# Patient Record
Sex: Male | Born: 1960 | Hispanic: No | Marital: Single | Smoking: Current every day smoker
Health system: Southern US, Community
[De-identification: ages and names within clinical notes are randomized; demographics above are authoritative.]

## PROBLEM LIST (undated history)

## (undated) HISTORY — PX: FINGER SURGERY: SHX640

## (undated) HISTORY — PX: HERNIA REPAIR: SHX51

---

## 1999-12-20 ENCOUNTER — Ambulatory Visit (HOSPITAL_BASED_OUTPATIENT_CLINIC_OR_DEPARTMENT_OTHER): Admission: RE | Admit: 1999-12-20 | Discharge: 1999-12-20 | Payer: Self-pay | Admitting: *Deleted

## 2003-11-13 ENCOUNTER — Emergency Department (HOSPITAL_COMMUNITY): Admission: EM | Admit: 2003-11-13 | Discharge: 2003-11-13 | Payer: Self-pay | Admitting: Emergency Medicine

## 2008-07-23 ENCOUNTER — Encounter (INDEPENDENT_AMBULATORY_CARE_PROVIDER_SITE_OTHER): Payer: Self-pay | Admitting: Orthopedic Surgery

## 2008-07-23 ENCOUNTER — Ambulatory Visit (HOSPITAL_COMMUNITY): Admission: EM | Admit: 2008-07-23 | Discharge: 2008-07-23 | Payer: Self-pay | Admitting: Emergency Medicine

## 2011-05-15 NOTE — Op Note (Signed)
Rickey Barnes, Rickey Barnes             ACCOUNT NO.:  [id number]   MEDICAL RECORD NO.:  [id number]          PATIENT TYPE:  INP   LOCATION:  1831                         FACILITY:  [facility]   PHYSICIAN:  Rickey Done, MD  DATE OF BIRTH:  03/28/1961   DATE OF PROCEDURE:  07/23/2008  DATE OF DISCHARGE:  07/23/2008                               OPERATIVE REPORT   PREOPERATIVE DIAGNOSIS:  Right small finger amputation, avulsion-type  amputation.   POSTOPERATIVE DIAGNOSIS:  Right small finger amputation, avulsion-type  amputation.   ATTENDING SURGEON:  Rickey Covert, MD, who scrubbed and present for  the entire procedure.   ASSISTANT SURGEON:  None.   PROCEDURES:  1. Right small finger debridement of skin, subcutaneous tissue, and      bone associated with open fracture.  2. Right small finger amputation with local neurectomies and primary      closure.   ANESTHESIA:  General via LMA.   TOURNIQUET TIME:  Less than 30 minutes at 225 mmHg.   SURGICAL INDICATIONS:  Rickey Barnes is a 50 year old right-hand-dominant  gentleman who sustained an avulsion-type amputation to his right small  finger from the level of the proximal portion of the distal phalanx  distally.  The patient presented with a loss of the digit in the  emergency department.  After the patient was seen and examined in the  emergency department, it was probably felt that the patient undergo  debridement and revision and completion of the amputation.  The risks,  benefits, and alternatives were discussed in detail with the patient and  signed informed consent was obtained.  We talked about reimplantation of  the digit, and we talked about amputation.  After discussing, it was my  recommendation at the end that the patient undergo the above procedure.   DESCRIPTION OF PROCEDURE:  The patient was properly identified in the  preoperative holding area and a marker was made on the right hand and  indicated correct operative  site.  The patient was then brought back to  the operating room and placed supine on the anesthesia room table where  general anesthesia was administered via LMA.  The patient tolerated this  well.  A well-padded tourniquet was then placed on the right forearm and  sealed with a 1000 drape.  The right upper extremity was then prepped  and draped in normal sterile fashion.  The patient received preoperative  antibiotics prior to any skin incisions.  After the prep and drape, the  time-out was called, the correct side was identified, and the procedure  was then begun.  The patient's amputation was through the level of the  proximal portion of the distal phalanx.  The FDP was noted to be still  attached to the distal segment.  The local neurectomies were then Barnes  both radially and ulnarly and the nerve was allowed to retract  proximally.  Following the neurectomies, debridement of skin,  subcutaneous tissues, and portion of the bone was then carried out.  The  wound was then thoroughly irrigated.  Following this, the skin flaps  were then closed  primarily.  There was no need for advanced flap  closure.  The wounds were then closed with 5-0 nylon simple sutures.  Marcaine 0.25% 9 mL were then infiltrated with the flexor tendon sheath  block.  The tourniquet was deflated with good perfusion of the finger.  Adaptic dressing and a sterile compressive dressing was then applied to  the digit.  The patient tolerated this well, was extubated, and taken to  recovery room in good condition.   POSTOPERATIVE PLAN:  The patient will be seen back in the office in 5  days for wound check and then application of a small protector finger  splint.  He will be discharged on the day of surgery.      Rickey Done, MD  Electronically Signed     FWO/MEDQ  D:  07/23/2008  T:  07/24/2008  Job:  [id number]

## 2013-08-02 ENCOUNTER — Emergency Department (HOSPITAL_COMMUNITY): Payer: Self-pay

## 2013-08-02 ENCOUNTER — Encounter (HOSPITAL_COMMUNITY): Payer: Self-pay | Admitting: Emergency Medicine

## 2013-08-02 ENCOUNTER — Emergency Department (HOSPITAL_COMMUNITY)
Admission: EM | Admit: 2013-08-02 | Discharge: 2013-08-02 | Disposition: A | Discharge status: Home / Self Care | Payer: Self-pay | Attending: Dermatology | Admitting: Dermatology

## 2013-08-02 DIAGNOSIS — F17200 Nicotine dependence, unspecified, uncomplicated: Secondary | ICD-10-CM | POA: Insufficient documentation

## 2013-08-02 DIAGNOSIS — S50311A Abrasion of right elbow, initial encounter: Secondary | ICD-10-CM

## 2013-08-02 DIAGNOSIS — Z23 Encounter for immunization: Secondary | ICD-10-CM | POA: Insufficient documentation

## 2013-08-02 DIAGNOSIS — Y9289 Other specified places as the place of occurrence of the external cause: Secondary | ICD-10-CM | POA: Insufficient documentation

## 2013-08-02 DIAGNOSIS — Y9389 Activity, other specified: Secondary | ICD-10-CM | POA: Insufficient documentation

## 2013-08-02 DIAGNOSIS — IMO0002 Reserved for concepts with insufficient information to code with codable children: Secondary | ICD-10-CM | POA: Insufficient documentation

## 2013-08-02 MED ORDER — TETANUS-DIPHTH-ACELL PERTUSSIS 5-2.5-18.5 LF-MCG/0.5 IM SUSP
0.5000 mL | Freq: Once | INTRAMUSCULAR | Status: AC
  Administered 2013-08-02: 0.5 mL via INTRAMUSCULAR
  Filled 2013-08-02: qty 0.5

## 2013-08-02 MED ORDER — HYDROCODONE-ACETAMINOPHEN 5-325 MG PO TABS: 1.0000 | ORAL_TABLET | Freq: Four times a day (QID) | ORAL | Status: AC | PRN
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
## 2013-08-11 NOTE — ED Provider Notes (Signed)
Medical screening examination/treatment/procedure(s) were performed by non-physician practitioner and as supervising physician I was immediately available for consultation/collaboration.   Suzi Roots, MD 08/11/13 2117
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
## 2014-11-28 ENCOUNTER — Encounter (HOSPITAL_COMMUNITY): Payer: Self-pay | Admitting: *Deleted

## 2014-11-28 ENCOUNTER — Emergency Department (HOSPITAL_COMMUNITY)
Admission: EM | Admit: 2014-11-28 | Discharge: 2014-11-28 | Disposition: A | Discharge status: Home / Self Care | Payer: Self-pay | Attending: Emergency Medicine | Admitting: Emergency Medicine

## 2014-11-28 DIAGNOSIS — K0889 Other specified disorders of teeth and supporting structures: Secondary | ICD-10-CM

## 2014-11-28 DIAGNOSIS — K088 Other specified disorders of teeth and supporting structures: Secondary | ICD-10-CM | POA: Insufficient documentation

## 2014-11-28 DIAGNOSIS — Z720 Tobacco use: Secondary | ICD-10-CM | POA: Insufficient documentation

## 2014-11-28 MED ORDER — AMOXICILLIN 500 MG PO CAPS: 500.0000 mg | ORAL_CAPSULE | Freq: Three times a day (TID) | ORAL | Status: AC

## 2014-11-28 NOTE — ED Provider Notes (Signed)
CSN: [id number]     Arrival date & time 11/28/14  1444 History   First MD Initiated Contact with Patient 11/28/14 1601     Chief Complaint  Patient presents with  . Dental Pain     (Consider location/radiation/quality/duration/timing/severity/associated sxs/prior Treatment) The history is provided by the patient. No language interpreter was used.  Rickey Barnes is a 53 y/o M with no known significant PMHx presenting to the ED with dental issues. Patient reported that he has been having loosening of his teeth since Thanksgiving. Patient reported that the loosening of his teeth is in the back portion of his mouth on the upper and lower aspects. Patient stated that he is not sure whether or not be bit into something hard. Stated that the pain is worse when he chews on the right side. Denied fever, chills, neck pain, neck stiffness, drainage, bleeding, chest pain, shortness of breath, difficulty breathing, swelling, numbness, tingling. PCP none   History reviewed. No pertinent past medical history. Past Surgical History  Procedure Laterality Date  . Finger surgery Right pinky finger  . Hernia repair     No family history on file. History  Substance Use Topics  . Smoking status: Current Every Day Smoker  . Smokeless tobacco: Not on file  . Alcohol Use: Yes    Review of Systems  Constitutional: Negative for fever and chills.  HENT: Positive for dental problem. Negative for trouble swallowing.   Respiratory: Negative for chest tightness and shortness of breath.       Allergies  Review of patient's allergies indicates no known allergies.  Home Medications   Prior to Admission medications   Medication Sig Start Date End Date Taking? Authorizing Provider  amoxicillin (AMOXIL) 500 MG capsule Take 1 capsule (500 mg total) by mouth 3 (three) times daily. 11/28/14   Missey Hasley, PA-C  HYDROcodone-acetaminophen (NORCO/VICODIN) 5-325 MG per tablet Take 1 tablet by mouth every 6  (six) hours as needed for pain. 08/02/13   Jamesetta Orleanshristopher W Lawyer, PA-C   BP 144/91 mmHg  Pulse 68  Temp(Src) 98.8 F (37.1 C) (Oral)  Resp 20  SpO2 100% Physical Exam  Constitutional: He is oriented to person, place, and time. He appears well-developed and well-nourished. No distress.  HENT:  Head: Normocephalic and atraumatic.  Mouth/Throat: Oropharynx is clear and moist.  Mild facial swelling noted to the right side of the face with negative erythema, inflammation, lesions, sores. Negative pain upon palpation. Negative trismus. Uvula midline with symmetrical elevation. Negative uvula swelling. Negative sublingual lesions noted.   Second molar and third molar of the right mandibular jawline noted to be loose and angulated. Gum tissue appears to be pushing the teeth up. Mild loosening of the teeth upon palpation. Angulation of the right second premolar of the maxillary jawline. Negative motion upon palpation or pain upon palpation.    Eyes: Conjunctivae and EOM are normal. Pupils are equal, round, and reactive to light. Right eye exhibits no discharge. Left eye exhibits no discharge.  Neck: Normal range of motion. Neck supple. No tracheal deviation present.  Negative neck stiffness Negative nuchal rigidity  Negative cervical lymphadenopathy  Negative meningeal signs  Cardiovascular: Normal rate, regular rhythm and normal heart sounds.  Exam reveals no friction rub.   No murmur heard. Pulmonary/Chest: Effort normal and breath sounds normal. No respiratory distress. He has no wheezes. He has no rales.  Patient is able to speak in full sentences without difficulty  Negative use of accessory muscles Negative stridor  Musculoskeletal: Normal range of motion.  Full ROM to upper and lower extremities without difficulty noted, negative ataxia noted.  Lymphadenopathy:    He has no cervical adenopathy.  Neurological: He is alert and oriented to person, place, and time. No cranial nerve deficit. He  exhibits normal muscle tone. Coordination normal.  Skin: Skin is warm and dry. No rash noted. He is not diaphoretic. No erythema.  Psychiatric: He has a normal mood and affect. His behavior is normal. Thought content normal.  Nursing note and vitals reviewed.   ED Course  Procedures (including critical care time) Labs Review Labs Reviewed - No data to display  Imaging Review No results found.   EKG Interpretation None      5:13 PM Patient seen and assessed by attending physician, Dr. Rhunette CroftNanavati. As per physician, reported that patient can be discharged home. Reported that teeth cannot be pulled, will be oral surgeon. [facility]   MDM   Final diagnoses:  Pain, dental  Loosening of tooth    Medications - No data to display  Filed Vitals:   11/28/14 1456 11/28/14 1701  BP: 142/83 144/91  Pulse: 70 68  Temp: 98.8 F (37.1 C)   TempSrc: Oral   Resp: 16 20  SpO2: 100% 100%   Patient presenting to the ED with loosening of his teeth to upper and lower jawline on the right side. Negative signs infection. Negative abscess or drainable abscess noted. Doubt peritonsillar abscess. Doubt retropharyngeal abscess. Loosening of teeth identified to exam. Poor dentition noted with beginnings of gingivitis and plaque. Patient seen and assessed by attending physician, Dr. Rhunette CroftNanavati. As per physician, reported that these teeth need to be extracted by an oral surgeon - recommended patient to be referred to oral surgeon/dentist. Negative signs of infection. Patient stable, afebrile. Patient not septic. Discharged patient. Discussed with patient the importance of following up with dentist and the need for those teeth to be properly extracted. Discussed with patient proper diet - recommended patient to refrain from foods that require chewing. Discussed with patient to closely monitor symptoms and if symptoms are to worsen or change to report back to the ED - strict return  instructions given.  Patient agreed to plan of care, understood, all questions answered.   Raymon MuttonMarissa Coady Train, PA-C 11/28/14 1816  Derwood KaplanAnkit Nanavati, MD 11/28/14 [phone]

## 2014-11-28 NOTE — ED Notes (Signed)
Declined W/C at D/C and was escorted to lobby by RN. 

## 2014-11-28 NOTE — Discharge Instructions (Signed)
Please call and set-up an appointment with Dentist for teeth to be pulled Please drink and stay hydrated Please try not to use teeth to bit - please stick with a soft diet of yogurt, pureed foods. Nothing that requires chewing.  Please continue to monitor symptoms closely and if symptoms are to worsen or change (fever greater than 101, chills, sweating, nausea, vomiting, chest pain, shortness of breathe, difficulty breathing, weakness, numbness, tingling, worsening or changes to pain pattern, swelling, bleeding, drainage) please report back to the Emergency Department immediately.   Dental Pain A tooth ache may be caused by cavities (tooth decay). Cavities expose the nerve of the tooth to air and hot or cold temperatures. It may come from an infection or abscess (also called a boil or furuncle) around your tooth. It is also often caused by dental caries (tooth decay). This causes the pain you are having. DIAGNOSIS  Your caregiver can diagnose this problem by exam. TREATMENT   If caused by an infection, it may be treated with medications which kill germs (antibiotics) and pain medications as prescribed by your caregiver. Take medications as directed.  Only take over-the-counter or prescription medicines for pain, discomfort, or fever as directed by your caregiver.  Whether the tooth ache today is caused by infection or dental disease, you should see your dentist as soon as possible for further care. SEEK MEDICAL CARE IF: The exam and treatment you received today has been provided on an emergency basis only. This is not a substitute for complete medical or dental care. If your problem worsens or new problems (symptoms) appear, and you are unable to meet with your dentist, call or return to this location. SEEK IMMEDIATE MEDICAL CARE IF:   You have a fever.  You develop redness and swelling of your face, jaw, or neck.  You are unable to open your mouth.  You have severe pain uncontrolled by  pain medicine. MAKE SURE YOU:   Understand these instructions.  Will watch your condition.  Will get help right away if you are not doing well or get worse. Document Released: 12/17/2005 Document Revised: 03/10/2012 Document Reviewed: 08/04/2008 [facility] Patient Information 2015 [place], [place]. This information is not intended to replace advice given to you by your health care provider. Make sure you discuss any questions you have with your health care provider.   Emergency Department Resource Guide 1) Find a Doctor and Pay Out of Pocket Although you won't have to find out who is covered by your insurance plan, it is a good idea to ask around and get recommendations. You will then need to call the office and see if the doctor you have chosen will accept you as a new patient and what types of options they offer for patients who are self-pay. Some doctors offer discounts or will set up payment plans for their patients who do not have insurance, but you will need to ask so you aren't surprised when you get to your appointment.  2) Contact Your Local Health Department Not all health departments have doctors that can see patients for sick visits, but many do, so it is worth a call to see if yours does. If you don't know where your local health department is, you can check in your phone book. The CDC also has a tool to help you locate your state's health department, and many state websites also have listings of all of their local health departments.  3) Find a Walk-in Clinic If your illness is  not likely to be very severe or complicated, you may want to try a walk in clinic. These are popping up all over the country in pharmacies, drugstores, and shopping centers. They're usually staffed by nurse practitioners or physician assistants that have been trained to treat common illnesses and complaints. They're usually fairly quick and inexpensive. However, if you have serious medical issues or chronic  medical problems, these are probably not your best option.  No Primary Care Doctor: - Call Health Connect at  [phone] - they can help you locate a primary care doctor that  accepts your insurance, provides certain services, etc. - Physician Referral Service- [phone]  Chronic Pain Problems: Organization         Address  Phone   Notes  [facility]  [phone] Patients need to be referred by their primary care doctor.   Medication Assistance: Organization         Address  Phone   Notes  [facility] Medication [facility] [address] [place] [place], [place] [address] [phone] --Must be a resident of [facility] -- Must have NO insurance coverage whatsoever (no Medicaid/ Medicare, etc.) -- The pt. MUST have a primary care doctor that directs their care regularly and follows them in the community   [facility]ist  [phone]   [employer]  [phone]    Agencies that provide inexpensive medical care: Organization         Address  Phone   Notes  [facility]  [phone]   [facility]    [phone]   [facility] [address] [place], [place] [address] [phone]   [facility] [address] [place], [place] [phone]   Planned Parenthood    [phone]   [facility]    [phone]   [facility]  [address], [place] Phone:  [phone], Fax:  [phone] Hours of Operation:  9 am - 6 pm, M-F.  Also accepts Medicaid/Medicare and self-pay.  [facility]  [address], [place] Phone: [phone], Fax: [phone]. Hours of Operation:  8:30 am - 5:30 pm, M-F.  Also accepts Medicaid and self-pay.  [facility] [place] [address], [place] Phone: [phone]   [facility] [address] Natasha Bence [place], [place] [phone],  Ext. [phone] Mondays & Thursdays: 7-9 AM.  First 15 patients are seen on a first come, first serve basis.    Medicaid-accepting [facility] Providers:  Organization         Address  Phone   Notes  [facility] [address], [place] [phone] Also accepts self-pay patients.  [facility] [address] Laurell Josephs [place], [place]  [phone]   [facility] [address], [place] [phone]   [facility] [address], [place] [phone]   Renaye Rakers [address], [place]   [phone] Only accepts [place] patients after they have their name applied to their card.   Self-Pay (no insurance) in [facility]:  Organization         Address  Phone   Notes  Sickle Cell Patients, [facility] [address] [place], [place] [phone]   Patrcia Dolly  [facility] [address], [place] [phone]) [phone]   [facility] [place]  [address], [place] [phone]) [phone]   [facility]/Dr. Osei-Bonsu  [address] [place] [address], [place] or [id number], [place] [phone] Phone number for both [place] and [place] locations is the same.  [facility] [address], [place] [phone]) [phone]   [facility] [address] [place] [address], [place] or [address] [place] [address] [phone]) [phone]) [phone]   [facility] [address], [place] [phone]) [phone], phone; [phone], fax Sees patients 1st and 3rd Saturday of every month.  Must not qualify for public or private insurance (i.e. Medicaid, Medicare, [place] Health Choice, Veterans' Benefits)  Household income should be no more than 200% of the poverty level The clinic cannot treat you if you are pregnant or think you are pregnant  Sexually transmitted diseases are not  treated at the clinic.    Dental Care: Organization         Address  Phone  Notes  [facility] [address], [place] [phone]) [phone] Accepts children up to age 921 who are enrolled in [place]icaid or [place] Health Choice; pregnant women with a Medicaid card; and children who have applied for Medicaid or [place] Health Choice, but were declined, whose parents can pay a reduced fee at time of service.  [facility] [place]  [address], [place] [phone] Accepts children up to age 52 who are enrolled in [place]icaid or [place] Health Choice; pregnant women with a Medicaid card; and children who have applied for Medicaid or [place] Health Choice, but were declined, whose parents can pay a reduced fee at time of service.  [facility]  [address], [place] [phone]) [phone] Patients are seen by appointment only. Walk-ins are not accepted. [facility] will see patients 52 years of age and older. Monday - Tuesday (8am-5pm) Most Wednesdays (8:30-5pm) $30 per visit, cash only  [facility]  [address], [facility] [phone] Patients are seen by appointment only. Walk-ins are not accepted. [facility] will see patients 52 years of age and older. One Wednesday Evening (Monthly: Volunteer Based).  $30 per visit, cash only  [employer] [facility]  [phone]) [phone] for adults; Children under age 264, call Graduate Pediatric Dentistry at [phone]) [phone]. Children aged 504-14, please call [phone]) [phone] to request a pediatric application.  Dental services are provided in all areas of dental care including fillings, crowns and bridges, complete and partial dentures, implants, gum treatment, root canals, and extractions. Preventive care is also provided. Treatment is provided to both adults and children. Patients are selected via a lottery and there is  often a waiting list.   [facility] [address], [place]  [phone] [email or web address]   [facility] [address], [place], [place] [phone], Ext. [phone] Second and Fourth Thursday of each month, opens at 6:30 AM; Clinic ends at 9 AM.  Patients are seen on a first-come first-served basis, and a limited number are seen during each clinic.   [facility]  [address], [place], [place] [phone]   Eligibility Requirements You must have lived in [place], [place], or [place] counties for at least the last  three months.   You cannot be eligible for state or federal sponsored National Cityhealthcare insurance, including CIGNAVeterans Administration, [place]icaid, or [employer]Medicare.   You generally cannot be eligible for healthcare insurance through your employer.    How to apply: Eligibility screenings are held every Tuesday and Wednesday afternoon from 1:00 pm until 4:00 pm. You do not need an appointment for the interview!  [facility] [address], [place], [place] [phone]   [facility]  [phone]   [facility]  [phone]   [facility]  [phone]    Behavioral Health Resources in the Community: Intensive Outpatient Programs Organization         Address  Phone  Notes  [facility] [address], [place], [place] [phone]   [facility] [address], [place], [place] [phone]   [facility] [address], [place], [place]  [phone]   [facility] [address],  [place], [place] [phone] or [phone]   Substance Abuse Resources Organization         Address  Phone  Notes  [facility] Services  [phone]   [facility]  [phone]   [facility]  [phone]   [facility]  [phone]   Residential & Outpatient Substance  Abuse Program  [phone]   Psychological Services Organization         Address  Phone  Notes  [facility]  [phone]- [phone]   [facility]  [phone]- [phone]   [facility] [address], [place] [phone] or [phone]    Mobile Crisis Teams Organization         Address  Phone  Notes  [facility], Mobile Crisis Care Unit  [phone]   [facility]  [address] [place], [place] [phone]   Doristine LocksSharon DeEsch [address] [place] [phone]    Self-Help/Support Groups Organization         Address  Phone             Notes  [facility] of [place] - variety of support groups  [phone]- [phone] Call for more information  Narcotics Anonymous (NA), [facility] [address], [place]  2 meetings at this location   Statisticianesidential Treatment Programs Organization         Address  Phone  Notes  [facility] [address],    [place]  [phone]   [facility]  [address], [place] [phone], [place], [place] [phone]   [facility] [address], [place] [phone] Admissions: 8am-3pm M-F  [facility] [address]    [place], [place] [phone]   [facility] [address], [place], [place] [phone]   [facility] [address]  [place], [place] [phone]   [facility] - Intensive Outpatient [address], [place], [place] [phone]   [facility] [address]  [place], [place] [phone] or [phone]   [facility] ([facility]) [address] [place], [place] [phone] Accepts Medicaid  [facility] [address]  [place] [phone] Substance Abuse/Addiction Treatment   [facility] Resources Organization          Address  Phone  Notes  [facility]  [phone]   Raynelle FanningJulie  Alvan DameBrannon, PhD [address], [address] [place], [place]   [phone]) [phone] or [phone]) [phone]   [facility]   [address] [place], [place] [phone](336) [phone]   [facility] [address], [place], [place] [phone]) [phone] Insurance/Medicaid/sponsorship through [facility]  [facility]ith [facility] Families [address]                                    [place], [place] [phone]) [phone] Therapy/tele-psych/case  [facility] [address]   [place], [place] [phone](336) [phone]    Dr. Lolly MustacheArfeen  [phone]) [phone]   [facility] [place]  [employer] [facility] 1) [address], [place] 2) [address], [place] 3)  [address], [place] [phone]) [phone](336) [phone]  [phone](336) [phone]   [facility] Child Abuse Hotline [phone]) [phone] or [phone]) [phone] (After Hours)

## 2014-11-28 NOTE — ED Notes (Signed)
Dental pain since Monday. Loose teeth on upper and lower right side. No dental appt scheduled.

## 2021-06-04 ENCOUNTER — Encounter (HOSPITAL_COMMUNITY): Payer: Self-pay | Admitting: Emergency Medicine

## 2021-06-04 ENCOUNTER — Other Ambulatory Visit: Payer: Self-pay

## 2021-06-04 ENCOUNTER — Ambulatory Visit (HOSPITAL_COMMUNITY)
Admission: EM | Admit: 2021-06-04 | Discharge: 2021-06-04 | Disposition: A | Discharge status: Home / Self Care | Payer: Self-pay | Attending: Student | Admitting: Student

## 2021-06-04 DIAGNOSIS — M5412 Radiculopathy, cervical region: Secondary | ICD-10-CM

## 2021-06-04 DIAGNOSIS — S46811A Strain of other muscles, fascia and tendons at shoulder and upper arm level, right arm, initial encounter: Secondary | ICD-10-CM

## 2021-06-04 DIAGNOSIS — M25511 Pain in right shoulder: Secondary | ICD-10-CM

## 2021-06-04 MED ORDER — PREDNISONE 20 MG PO TABS: 40.0000 mg | ORAL_TABLET | Freq: Every day | ORAL | 0 refills | Status: AC

## 2021-06-04 MED ORDER — TIZANIDINE HCL 2 MG PO CAPS: 2.0000 mg | ORAL_CAPSULE | Freq: Three times a day (TID) | ORAL | 0 refills | Status: DC

## 2021-06-04 NOTE — ED Provider Notes (Signed)
[facility]    CSN: [id number] Arrival date & time: 06/04/21  1122      History   Chief Complaint Chief Complaint  Patient presents with  . Shoulder Pain    Right    HPI Rickey Barnes is a 60 y.o. male presenting with right shoulder pain for 3 weeks.  Getting worse over the last 3 days.  Medical history- patient states he did injure the right shoulder >1 year ago due to falling, never sought medical attention for this and states his pain resolved from this. However presenting today with R shoulder pain, worse with movement. Shooting pain down arm and numbness and tingling in pinkie finger. 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 Denies sensation changes, weakness. Denies pain elsewehre. Denies neck/back pain. Has tried alleve with minimal improvement.  HPI  History reviewed. No pertinent past medical history.  There are no problems to display for this patient.   Past Surgical History:  Procedure Laterality Date  . FINGER SURGERY Right pinky finger  . HERNIA REPAIR         Home Medications    Prior to Admission medications   Medication Sig Start Date End Date Taking? Authorizing Provider  predniSONE (DELTASONE) 20 MG tablet Take 2 tablets (40 mg total) by mouth daily for 5 days. 06/04/21 06/09/21 Yes Rhys Martini, PA-C  tizanidine (ZANAFLEX) 2 MG capsule Take 1 capsule (2 mg total) by mouth 3 (three) times daily. 06/04/21  Yes Rhys Martini, PA-C  amoxicillin (AMOXIL) 500 MG capsule Take 1 capsule (500 mg total) by mouth 3 (three) times daily. 11/28/14   Sciacca, Marissa, PA-C  HYDROcodone-acetaminophen (NORCO/VICODIN) 5-325 MG per tablet Take 1 tablet by mouth every 6 (six) hours as needed for pain. 08/02/13   Charlestine Night, PA-C    Family History History reviewed. No pertinent family history.  Social History Social History   Tobacco Use  . Smoking status: Current Every Day Smoker  Substance Use Topics  . Alcohol use: Yes   . Drug use: No     Allergies   Patient has no known allergies.   Review of Systems Review of Systems  Musculoskeletal:       Right shoulder pain  All other systems reviewed and are negative.    Physical Exam Triage Vital Signs ED Triage Vitals  Enc Vitals Group     BP 06/04/21 1213 (!) 159/93     Pulse Rate 06/04/21 1213 75     Resp 06/04/21 1213 17     Temp 06/04/21 1213 98.9 F (37.2 C)     Temp Source 06/04/21 1213 Oral     SpO2 06/04/21 1213 100 %     Weight --      Height --      Head Circumference --      Peak Flow --      Pain Score 06/04/21 1211 9     Pain Loc --      Pain Edu? --      Excl. in GC? --    No data found.  Updated Vital Signs BP (!) 159/93 (BP Location: Right Arm)   Pulse 75   Temp 98.9 F (37.2 C) (Oral)   Resp 17   SpO2 100%   Visual Acuity Right Eye Distance:   Left Eye Distance:   Bilateral Distance:    Right Eye Near:   Left Eye Near:    Bilateral Near:  Physical Exam Vitals reviewed.  Constitutional:      General: He is not in acute distress.    Appearance: Normal appearance. He is not ill-appearing.  HENT:     Head: Normocephalic and atraumatic.  Eyes:     Extraocular Movements: Extraocular movements intact.     Pupils: Pupils are equal, round, and reactive to light.  Cardiovascular:     Rate and Rhythm: Normal rate and regular rhythm.     Heart sounds: Normal heart sounds.  Pulmonary:     Effort: Pulmonary effort is normal.     Breath sounds: Normal breath sounds and air entry.  Abdominal:     Tenderness: There is no abdominal tenderness. There is no right CVA tenderness, left CVA tenderness, guarding or rebound.  Musculoskeletal:     Cervical back: Normal range of motion. No swelling, deformity, signs of trauma, rigidity, spasms, tenderness, bony tenderness or crepitus. No pain with movement.     Thoracic back: No swelling, deformity, signs of trauma, spasms, tenderness or bony tenderness. Normal range of  motion. No scoliosis.     Lumbar back: No swelling, deformity, signs of trauma, spasms, tenderness or bony tenderness. Normal range of motion. Negative right straight leg raise test and negative left straight leg raise test. No scoliosis.     Comments: R proximal trapezius - TTP.  No cervical spinous or paraspinous muscle tenderness.  Negative Spurling.  R anterior shoulder diffusely TTP, without bony deformity. ROM shoulder intact but pain with abduction against resistance. No bony deformity. No clavicular tenderness or deformity. No humeral tenderness or deformity. Negative empty beer, neer, crossbody adduction.   No right elbow tenderness, range of motion elbow intact and without pain.  No right wrist tenderness, range of motion wrist intact and without pain.  Radial pulse 2+, cap refill is in 2 seconds.  Grip strength 5 out of 5 bilaterally.  Negative Phalen sign.  Absolutely no other injury, deformity, tenderness, ecchymosis, abrasion.  Skin:    Capillary Refill: Capillary refill takes less than 2 seconds.  Neurological:     General: No focal deficit present.     Mental Status: He is alert and oriented to person, place, and time.     Cranial Nerves: No cranial nerve deficit.  Psychiatric:        Mood and Affect: Mood normal.        Behavior: Behavior normal.        Thought Content: Thought content normal.        Judgment: Judgment normal.      UC Treatments / Results  Labs (all labs ordered are listed, but only abnormal results are displayed) Labs Reviewed - No data to display  EKG   Radiology No results found.  Procedures Procedures (including critical care time)  Medications Ordered in UC Medications - No data to display  Initial Impression / Assessment and Plan / UC Course  I have reviewed the triage vital signs and the nursing notes.  Pertinent labs & imaging results that were available during my care of the patient were reviewed by me and considered in my    Follow-up with Ortho if symptoms persist.medical decision making (see chart for details).     This patient is a 61 year old male presenting with right trapezius pain and cervical radiculitis.  Suspect there is also an osteoarthritis component.  He is not a diabetic, prednisone as below.  Zanaflex sent.  Final Clinical Impressions(s) / UC Diagnoses   Final diagnoses:  Acute  pain of right shoulder  Strain of right trapezius muscle, initial encounter  Cervical radiculitis     Discharge Instructions     -Prednisone, 2 pills taken at the same time for 5 days in a row.  Try taking this earlier in the day as it can give you energy. Avoid ibuprofen while taking this medication. -Start the muscle relaxer-Zanaflex (tizanidine), up to 3 times daily for muscle spasms and pain.  This can make you drowsy, so take at bedtime or when you do not need to drive or operate machinery. -Tylenol, heat/ice -Limit use of the arm while you're having this pain. This may mean light duty at work. Work note provided.  -Follow-up with orthopedist if symptoms persist, information below.     ED Prescriptions    Medication Sig Dispense Auth. Provider   predniSONE (DELTASONE) 20 MG tablet Take 2 tablets (40 mg total) by mouth daily for 5 days. 10 tablet Rhys Martini, PA-C   tizanidine (ZANAFLEX) 2 MG capsule Take 1 capsule (2 mg total) by mouth 3 (three) times daily. 21 capsule Rhys Martini, PA-C     PDMP not reviewed this encounter.   Rhys Martini, PA-C 06/04/21 1323

## 2021-06-04 NOTE — Discharge Instructions (Addendum)
-  Prednisone, 2 pills taken at the same time for 5 days in a row.  Try taking this earlier in the day as it can give you energy. Avoid ibuprofen while taking this medication. -Start the muscle relaxer-Zanaflex (tizanidine), up to 3 times daily for muscle spasms and pain.  This can make you drowsy, so take at bedtime or when you do not need to drive or operate machinery. -Tylenol, heat/ice -Limit use of the arm while you're having this pain. This may mean light duty at work. Work note provided.  -Follow-up with orthopedist if symptoms persist, information below.

## 2021-06-04 NOTE — ED Triage Notes (Signed)
Pt presents with Right shoulder pain xs 2-3 weeks. States within the 2-3 days unable to lift shoulder without severe pain. States has been taking multiple different OTC medications with no relief.

## 2021-06-12 ENCOUNTER — Encounter: Payer: Self-pay | Admitting: Internal Medicine

## 2022-02-21 ENCOUNTER — Encounter: Payer: Self-pay | Admitting: Internal Medicine

## 2024-02-27 ENCOUNTER — Ambulatory Visit: Payer: Medicaid Other | Admitting: Family Medicine

## 2024-03-02 ENCOUNTER — Ambulatory Visit (INDEPENDENT_AMBULATORY_CARE_PROVIDER_SITE_OTHER): Payer: Medicaid Other | Admitting: Family Medicine

## 2024-03-02 ENCOUNTER — Other Ambulatory Visit: Payer: Self-pay

## 2024-03-02 ENCOUNTER — Encounter: Payer: Self-pay | Admitting: Family Medicine

## 2024-03-02 VITALS — BP 138/88 | Ht 68.0 in | Wt 165.0 lb

## 2024-03-02 DIAGNOSIS — M75111 Incomplete rotator cuff tear or rupture of right shoulder, not specified as traumatic: Secondary | ICD-10-CM | POA: Insufficient documentation

## 2024-03-02 DIAGNOSIS — M5010 Cervical disc disorder with radiculopathy, unspecified cervical region: Secondary | ICD-10-CM

## 2024-03-02 DIAGNOSIS — G8929 Other chronic pain: Secondary | ICD-10-CM

## 2024-03-02 MED ORDER — PREDNISONE 10 MG PO TABS: ORAL_TABLET | ORAL | 0 refills | Status: AC

## 2024-03-02 MED ORDER — TIZANIDINE HCL 2 MG PO CAPS: 2.0000 mg | ORAL_CAPSULE | Freq: Three times a day (TID) | ORAL | 0 refills | Status: DC | PRN

## 2024-03-02 NOTE — Assessment & Plan Note (Addendum)
 Acute on chronic right shoulder pain, acutely exacerbated over the last week without any injury or trauma  Plan: -Prior urgent care notes from 06/04/2021 reviewed.  Had similar symptoms at that time.  Was treated with oral prednisone and Zanaflex.  Did not have any imaging at that time -[facility] ultrasound completed today with findings as noted above -Recommend right shoulder and C-spine x-rays to rule out bony abnormality -Rx 6-day prednisone taper.  Should not take with any other NSAIDs -Rx tizanidine 3 times daily as needed -Will reach out with x-ray results and discuss further treatment which could include injection, PT, GYN, or referral to orthopedic surgeon

## 2024-03-02 NOTE — Progress Notes (Signed)
 DATE OF VISIT: 03/02/2024        Rickey Barnes DOB: 1961/05/04 MRN: [id number]  CC:  RT shoulder pain  History- Rickey Barnes is a 63 y.o. RT-hand dominant male for evaluation and treatment of Rt shoulder pain Intermittent pain for years Increasing pain over the last 1 week Denies recent injury/trauma Pain in the right neck that occ radiates down the right arm into the hand Some intermittent numbness/tingling into the hand Worse with lifting and overhead activities (+)Night pain Limited improvement with Ibuprofen 800mg  bid prn & Mobic prn Limited improvement with home TENS No prior imaging  Job: tree trimming business  Past Medical History History reviewed. No pertinent past medical history.  Past Surgical History Past Surgical History:  Procedure Laterality Date   FINGER SURGERY Right pinky finger   HERNIA REPAIR      Medications Current Outpatient Medications  Medication Sig Dispense Refill   predniSONE (DELTASONE) 10 MG tablet Use as directed per doctors orders for the next 6 days. 21 tablet 0   amoxicillin (AMOXIL) 500 MG capsule Take 1 capsule (500 mg total) by mouth 3 (three) times daily. 21 capsule 0   HYDROcodone-acetaminophen (NORCO/VICODIN) 5-325 MG per tablet Take 1 tablet by mouth every 6 (six) hours as needed for pain. 15 tablet 0   tizanidine (ZANAFLEX) 2 MG capsule Take 1 capsule (2 mg total) by mouth 3 (three) times daily as needed for muscle spasms. 21 capsule 0   No current facility-administered medications for this visit.    Allergies has no known allergies.  Family History - reviewed per EMR and intake form  Social History   reports current alcohol use.  reports that he has been smoking. He does not have any smokeless tobacco history on file.  reports no history of drug use. OCCUPATION: tree trimming business   EXAM: Vitals: BP 138/88   Ht 5\' 8"  (1.727 m)   Wt 165 lb (74.8 kg)   BMI 25.09 kg/m  General: AOx3, NAD, pleasant SKIN: no  rashes or lesions, skin clean, dry, intact [facility]: C-spine: Decreased forward flexion and extension with pain.  Normal rotation bilaterally.  No midline tenderness.  Mild right-sided paraspinal tenderness extending into the right trapezius.  Mildly positive Spurling's on the right, negative on the left  Shoulder: Right shoulder without any gross deformity.  Mild tenderness palpation over the bicipital groove and the greater tuberosity.  Slightly decreased active range of motion in all planes limited by approximately 10 to 15% and limited by pain.  Near full passive range of motion in all planes with pain.  Positive painful arc.  Positive empty can, positive Hawkins, positive Neer, positive speeds, negative drop arm.  Rotator cuff strength 4/5 throughout. Left shoulder with full range of motion without pain, weakness, instability.  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  Normal grip strength bilaterally  NEURO: sensation intact to light touch, DTR + 2/4 bicep, tricep, brachioradialis bilaterally VASC: pulses 2+ and symmetric radial artery bilaterally, no edema  IMAGING: [facility] ultrasound right shoulder Date: 03/02/2024 Indication: Right shoulder pain Findings: -Biceps tendon with normal appearance, significant amount of increased fluid in the biceps tendon sheath distally. -Normal-appearing pectoralis major attachment on the proximal humerus -Subscapularis visualized in short and long axis views.  Significant cortical irregularity noted along the humeral head.  Signs of near full-thickness tear at the insertional footplate.  Some calcifications also noted within the tendon. -No dynamic impingement on the coracoid -  AC joint degenerative changes -Small amount of increased fluid in the subacromial bursa.  No significant dynamic impingement -Supraspinatus with articular surface partial tear.  Some cortical irregularity noted along the humeral  head. -Flattening of the infraspinatus with some associated tendinopathy.  No signs of tears -Normal-appearing teres minor -Glenohumeral joint with mild degenerative changes  Impression: -Near full-thickness subscapularis tear with significant cortical irregularity noted -Partial-thickness articular sided supraspinatus tear, also with some cortical irregularity -Infraspinatus tendinopathy -AC joint DJD  Images and interpretation completed by Darene Lamer, DO    Assessment & Plan Nontraumatic incomplete tear of right rotator cuff Acute on chronic right shoulder pain, acutely exacerbated over the last week without any injury or trauma  Plan: -Prior urgent care notes from 06/04/2021 reviewed.  Had similar symptoms at that time.  Was treated with oral prednisone and Zanaflex.  Did not have any imaging at that time -[facility] ultrasound completed today with findings as noted above -Recommend right shoulder and C-spine x-rays to rule out bony abnormality -Rx 6-day prednisone taper.  Should not take with any other NSAIDs -Rx tizanidine 3 times daily as needed -Will reach out with x-ray results and discuss further treatment which could include injection, PT, GYN, or referral to orthopedic surgeon Cervical disc disorder with radiculopathy of cervical region Acute on chronic right shoulder pain with acute exacerbation over the last week, has associated right-sided radicular symptoms.  Had similar symptoms in the past when evaluated urgent care 06/04/2021  Plan: -Imaging: C-spine x-rays to rule out bony abnormality - Rx 6-day prednisone taper.  Should not take with any other NSAIDs -Rx tizanidine 3 times daily as needed -Will reach out with x-ray results and discuss further treatment  Patient expressed understanding & agreement with above.  Encounter Diagnoses  Name Primary?   Nontraumatic incomplete tear of right rotator cuff Yes   Cervical disc disorder with radiculopathy of cervical region      Orders Placed This Encounter  Procedures   [place] LIMITED JOINT SPACE STRUCTURES UP RIGHT   DG Shoulder Right   DG Cervical Spine 2 or 3 views    Orders Placed This Encounter  Procedures   [place] LIMITED JOINT SPACE STRUCTURES UP RIGHT   DG Shoulder Right   DG Cervical Spine 2 or 3 views

## 2024-03-03 ENCOUNTER — Other Ambulatory Visit: Payer: Self-pay

## 2024-03-03 MED ORDER — CYCLOBENZAPRINE HCL 10 MG PO TABS: 10.0000 mg | ORAL_TABLET | Freq: Every evening | ORAL | 0 refills | Status: DC | PRN

## 2024-03-03 NOTE — Progress Notes (Signed)
 Changed Rx d/t pt insurance requiring PA for zanaflex.

## 2024-03-04 ENCOUNTER — Ambulatory Visit
Admission: RE | Admit: 2024-03-04 | Discharge: 2024-03-04 | Disposition: A | Discharge status: Home / Self Care | Source: Ambulatory Visit | Attending: Family Medicine | Admitting: Family Medicine

## 2024-03-04 DIAGNOSIS — M5010 Cervical disc disorder with radiculopathy, unspecified cervical region: Secondary | ICD-10-CM

## 2024-03-04 DIAGNOSIS — M75111 Incomplete rotator cuff tear or rupture of right shoulder, not specified as traumatic: Secondary | ICD-10-CM

## 2024-03-06 NOTE — Progress Notes (Signed)
 X-ray results reviewed.  Shoulder x-ray showing chronic cystic change and reactive sclerosis, as well as high riding humerus consistent with chronic rotator cuff tear.  This correlates with [facility] ultrasound findings.  C-spine x-rays also with multilevel degenerative changes.  Tried calling patient 03/06/2024 at 10 AM EST.  No answer.  Left message on machine indicating should call office to schedule follow-up appointment to reassess and review his x-ray results.  Unable to leave detailed message because just had a generic voicemail message.

## 2024-03-09 ENCOUNTER — Encounter: Payer: Self-pay | Admitting: Emergency Medicine

## 2024-03-09 ENCOUNTER — Ambulatory Visit
Admission: EM | Admit: 2024-03-09 | Discharge: 2024-03-09 | Disposition: A | Discharge status: Home / Self Care | Attending: Family Medicine | Admitting: Family Medicine

## 2024-03-09 DIAGNOSIS — K047 Periapical abscess without sinus: Secondary | ICD-10-CM

## 2024-03-09 MED ORDER — CLINDAMYCIN HCL 300 MG PO CAPS: 300.0000 mg | ORAL_CAPSULE | Freq: Three times a day (TID) | ORAL | 0 refills | Status: AC

## 2024-03-09 NOTE — ED Triage Notes (Addendum)
 Pt c/o left side face and mouth swelling a pain for 2 days.  He just finished a round of prednisone yesterday

## 2024-03-09 NOTE — Discharge Instructions (Signed)
 Follow up with a dental provider.

## 2024-10-21 ENCOUNTER — Ambulatory Visit: Admitting: Family Medicine

## 2024-10-21 ENCOUNTER — Encounter: Payer: Self-pay | Admitting: Internal Medicine

## 2024-10-21 ENCOUNTER — Ambulatory Visit: Admitting: Internal Medicine

## 2024-10-21 VITALS — BP 130/82 | Ht 68.0 in | Wt 160.0 lb

## 2024-10-21 DIAGNOSIS — M5416 Radiculopathy, lumbar region: Secondary | ICD-10-CM | POA: Diagnosis present

## 2024-10-21 MED ORDER — MELOXICAM 15 MG PO TABS: 15.0000 mg | ORAL_TABLET | Freq: Every day | ORAL | 0 refills | Status: AC

## 2024-10-21 MED ORDER — CYCLOBENZAPRINE HCL 10 MG PO TABS: 10.0000 mg | ORAL_TABLET | Freq: Every evening | ORAL | 0 refills | Status: AC | PRN

## 2024-10-21 NOTE — Progress Notes (Cosign Needed)
 PCP: Patient, No Pcp Per  Patient is a 63 y.o. male here for acute right sided lumbar back pain.  Pain began Monday evening (10/20) after work.  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  He describes pain that is worse with movement in general, particularly with lateral movements at the waist.  Pain radiates distally into the right leg with standing.  Ibuprofen seems to help.  He denies numbness or weakness in the right lower extremity, additionally denies saddle anesthesia, bowel/bladder incontinence, night sweats, fever/chills, and unintentional weight loss.  He states that he had similar symptoms many years ago after lifting heavy objects.  No past medical history on file.  Current Outpatient Medications on File Prior to Visit  Medication Sig Dispense Refill   amoxicillin  (AMOXIL ) 500 MG capsule Take 1 capsule (500 mg total) by mouth 3 (three) times daily. (Patient not taking: Reported on 03/09/2024) 21 capsule 0   cyclobenzaprine  (FLEXERIL ) 10 MG tablet Take 1 tablet (10 mg total) by mouth at bedtime as needed for muscle spasms. 14 tablet 0   HYDROcodone -acetaminophen  (NORCO/VICODIN) 5-325 MG per tablet Take 1 tablet by mouth every 6 (six) hours as needed for pain. 15 tablet 0   predniSONE  (DELTASONE ) 10 MG tablet Use as directed per doctors orders for the next 6 days. (Patient not taking: Reported on 03/09/2024) 21 tablet 0   No current facility-administered medications on file prior to visit.    Past Surgical History:  Procedure Laterality Date   FINGER SURGERY Right pinky finger   HERNIA REPAIR      No Known Allergies  BP 130/82   Ht 5' 8 (1.727 m)   Wt 160 lb (72.6 kg)   BMI 24.33 kg/m       No data to display              No data to display              Objective:  Physical Exam:  Gen: NAD, comfortable in exam room  Lumbar spine No gross deformity, scoliosis. TTP right paraspinal muscles of lumbar region.   No midline or bony TTP. FROM. Strength LEs 5/5 all muscle groups.   2+ MSRs in patellar and achilles tendons, equal bilaterally. Negative SLRs. Sensation intact to light touch bilaterally.   Assessment and Plan:  Right lumbar strain with radiculopathy Acute right-sided lumbar back pain x 2 days with radiation of pain to the right leg.  No red flag symptoms identified.  His exam is reassuring.  Findings seem most consistent with a lumbar strain with radiculopathy.  Treatment options reviewed.  Recommend NSAIDs for pain relief.  A prescription for meloxicam has been sent at his request.  I have also added Flexeril  10 mg nightly at his request as this has worked well for other musculoskeletal injuries in the past.  He was given a home exercise program and printed form today.  He will follow-up if pain worsens or fails to improve.

## 2024-10-21 NOTE — Patient Instructions (Signed)
 It was a pleasure to see you today.  Thank you for giving us  the opportunity to be involved in your care.  Below is a brief recap of your visit and next steps.   Summary The symptoms you reported and my exam findings today are concerning for a strain of the muscles along the right side of your back.  As we discussed, I prescribed meloxicam as an anti-inflammatory for pain relief.  I have also added a muscle relaxer (cyclobenzaprine ).  This should be taken at night.  Please review the exercises you were given as they will help with rehabilitation as well.  Please return to care if your pain worsens or does not improve over the next 2 weeks.
